# Patient Record
Sex: Male | Born: 1970 | Race: White | Hispanic: No | Marital: Married | Smoking: Never smoker
Health system: Southern US, Community
[De-identification: ages and names within clinical notes are randomized; demographics above are authoritative.]

## PROBLEM LIST (undated history)

## (undated) DIAGNOSIS — J302 Other seasonal allergic rhinitis: Secondary | ICD-10-CM

## (undated) DIAGNOSIS — G43019 Migraine without aura, intractable, without status migrainosus: Secondary | ICD-10-CM

## (undated) DIAGNOSIS — G4730 Sleep apnea, unspecified: Secondary | ICD-10-CM

## (undated) DIAGNOSIS — K219 Gastro-esophageal reflux disease without esophagitis: Secondary | ICD-10-CM

## (undated) DIAGNOSIS — R51 Headache: Secondary | ICD-10-CM

## (undated) DIAGNOSIS — R519 Headache, unspecified: Secondary | ICD-10-CM

## (undated) DIAGNOSIS — T7840XA Allergy, unspecified, initial encounter: Secondary | ICD-10-CM

## (undated) DIAGNOSIS — R251 Tremor, unspecified: Secondary | ICD-10-CM

## (undated) DIAGNOSIS — F419 Anxiety disorder, unspecified: Secondary | ICD-10-CM

## (undated) DIAGNOSIS — R413 Other amnesia: Secondary | ICD-10-CM

## (undated) DIAGNOSIS — G40909 Epilepsy, unspecified, not intractable, without status epilepticus: Secondary | ICD-10-CM

## (undated) DIAGNOSIS — F329 Major depressive disorder, single episode, unspecified: Secondary | ICD-10-CM

## (undated) DIAGNOSIS — E785 Hyperlipidemia, unspecified: Secondary | ICD-10-CM

## (undated) DIAGNOSIS — F32A Depression, unspecified: Secondary | ICD-10-CM

## (undated) HISTORY — DX: Hyperlipidemia, unspecified: E78.5

## (undated) HISTORY — DX: Other seasonal allergic rhinitis: J30.2

## (undated) HISTORY — DX: Sleep apnea, unspecified: G47.30

## (undated) HISTORY — DX: Headache: R51

## (undated) HISTORY — DX: Depression, unspecified: F32.A

## (undated) HISTORY — DX: Migraine without aura, intractable, without status migrainosus: G43.019

## (undated) HISTORY — PX: WISDOM TOOTH EXTRACTION: SHX21

## (undated) HISTORY — DX: Gastro-esophageal reflux disease without esophagitis: K21.9

## (undated) HISTORY — DX: Other amnesia: R41.3

## (undated) HISTORY — DX: Tremor, unspecified: R25.1

## (undated) HISTORY — DX: Major depressive disorder, single episode, unspecified: F32.9

## (undated) HISTORY — DX: Epilepsy, unspecified, not intractable, without status epilepticus: G40.909

## (undated) HISTORY — DX: Headache, unspecified: R51.9

## (undated) HISTORY — DX: Anxiety disorder, unspecified: F41.9

## (undated) HISTORY — PX: KNEE SURGERY: SHX244

## (undated) HISTORY — DX: Allergy, unspecified, initial encounter: T78.40XA

---

## 2010-07-18 DIAGNOSIS — G40909 Epilepsy, unspecified, not intractable, without status epilepticus: Secondary | ICD-10-CM

## 2010-07-18 HISTORY — DX: Epilepsy, unspecified, not intractable, without status epilepticus: G40.909

## 2013-12-25 ENCOUNTER — Encounter: Payer: Self-pay | Admitting: Gastroenterology

## 2016-09-30 ENCOUNTER — Encounter: Payer: Self-pay | Admitting: Neurology

## 2016-09-30 ENCOUNTER — Ambulatory Visit (INDEPENDENT_AMBULATORY_CARE_PROVIDER_SITE_OTHER): Payer: No Typology Code available for payment source | Admitting: Neurology

## 2016-09-30 ENCOUNTER — Encounter (INDEPENDENT_AMBULATORY_CARE_PROVIDER_SITE_OTHER): Payer: Self-pay

## 2016-09-30 DIAGNOSIS — G43019 Migraine without aura, intractable, without status migrainosus: Secondary | ICD-10-CM

## 2016-09-30 HISTORY — DX: Migraine without aura, intractable, without status migrainosus: G43.019

## 2016-09-30 NOTE — Progress Notes (Signed)
Reason for visit: Migraine headache  Referring physician: Dr. Antonietta BreachSchultz  Louis Poole is a 46 y.o. male  History of present illness:  Louis Poole is a 46 year old right-handed white male with a history of migraine headaches. The patient began having headaches with some regularity in June 2017. The patient has been placed on various medications for his headache initially without much benefit. He has been on propranolol, amitriptyline, Effexor, and gabapentin. Within the last 2 weeks his headaches have gone from being daily in nature to having only one headache within the last 2 weeks. The headaches occur on the right temporal area starting with a feeling of tension, then going into a throbbing pain that is associated with scalp tenderness, photophobia, and phonophobia. The patient may get queasy, but he does not vomit. He denies any visual complaints with the headache. He does feel fuzzy headed, he may have difficulty concentrating. The headaches usually last all day long unless treated. The patient has found that Zomig nasal spray will take care of the headache within about half an hour. The patient has also taken Fioricet. The patient occasionally will have some neck stiffness with headache. He reports no numbness or weakness of the face, arms, or legs. He denies any difficulty with balance or difficulty controlling the bowels or the bladder. He takes and large amounts of caffeine during the day, he drinks 3 or 4 cups of coffee, 4 Cokes, and 3 or 4 glasses of iced tea daily. The patient has been under a lot of stress with an illness with his wife who has breast cancer. The patient's father also has a history of headache. The patient is sent to this office for further evaluation.  The patient also has a history of partial complex seizures, the last seizure was on 10/09/2010. 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 He is on Lamictal for this issue,  [facility]  Past Medical History:  Diagnosis Date  . Anxiety   . Depression   . Epilepsy (HCC)   . Headache     History reviewed. No pertinent surgical history.  History reviewed. No pertinent family history.  Social history:  reports that he has never smoked. He has never used smokeless tobacco. He reports that he does not drink alcohol or use drugs.  Medications:  Prior to Admission medications   Medication Sig Start Date End Date Taking? Authorizing Provider  amitriptyline (ELAVIL) 50 MG tablet Take by mouth.   Yes Historical Provider, MD  butalbital-acetaminophen-caffeine (FIORICET, ESGIC) 50-325-40 MG tablet Take 1 tablet by mouth 4 (four) times daily as needed. 08/26/16  Yes Historical Provider, MD  gabapentin (NEURONTIN) 300 MG capsule TAKE ONE CAPSULE BY MOUTH AT BEDTIME TO help tension HEADACHE 09/15/16  Yes Historical Provider, MD  lamoTRIgine (LAMICTAL) 150 MG tablet Take 225 mg by mouth 2 (two) times daily. 07/27/16  Yes Historical Provider, MD  levocetirizine (XYZAL) 5 MG tablet Take by mouth. 10/28/10  Yes Historical Provider, MD  LORazepam (ATIVAN) 1 MG tablet TAKE ONE TABLET BY MOUTH AS NEEDED, take 0.5 in the am and 1 mg in evening for anxiety 03/01/16  Yes Historical Provider, MD  montelukast (SINGULAIR) 10 MG tablet Take by mouth at bedtime.  10/27/10  Yes Historical Provider, MD  Multiple Vitamin (MULTI-VITAMINS) TABS Take by mouth.   Yes Historical Provider, MD  pantoprazole (PROTONIX) 40 MG tablet Take by mouth. 08/04/14  Yes Historical Provider, MD  propranolol (INDERAL) 40 MG tablet 40 mg in the morning and 80 mg in the evening 05/31/16  Yes Historical Provider, MD  venlafaxine XR (EFFEXOR-XR) 150 MG 24 hr capsule Take by mouth. 05/31/16  Yes Historical Provider, MD  Vitamin D, Ergocalciferol, (DRISDOL) 50000 units CAPS capsule TAKE ONE CAPSULE BY MOUTH ONCE WEEKLY FOR EIGHT WEEKS 07/25/16  Yes Historical Provider, MD  ZOMIG 5 MG nasal  solution instil ONE SPRAY IN EACH NOSTRIL AT OF ONSET OF HEADACHE 08/26/16  Yes Historical Provider, MD     No Known Allergies  ROS:  Out of a complete 14 system review of symptoms, the patient complains only of the following symptoms, and all other reviewed systems are negative.  Fatigue Constipation Allergies Seizures Depression, anxiety, not enough sleep, decreased energy  Blood pressure 102/69, pulse 73, height 5\' 5"  (1.651 m), weight 188 lb 12.8 oz (85.6 kg).  Physical Exam  General: The patient is alert and cooperative at the time of the examination.  Eyes: Pupils are equal, round, and reactive to light. Discs are flat bilaterally.  Neck: The neck is supple, no carotid bruits are noted.  Respiratory: The respiratory examination is clear.  Cardiovascular: The cardiovascular examination reveals a regular rate and rhythm, no obvious murmurs or rubs are noted.  Skin: Extremities are without significant edema.  Neurologic Exam  Mental status: The patient is alert and oriented x 3 at the time of the examination. The patient has apparent normal recent and remote memory, with an apparently normal attention span and concentration ability.  Cranial nerves: Facial symmetry is present. There is good sensation of the face to pinprick and soft touch bilaterally. The strength of the facial muscles and the muscles to head turning and shoulder shrug are normal bilaterally. Speech is well enunciated, no aphasia or dysarthria is noted. Extraocular movements are full. Visual fields are full. The tongue is midline, and the patient has symmetric elevation of the soft palate. No obvious hearing deficits are noted.  Motor: The motor testing reveals 5 over 5 strength of all 4 extremities. Good symmetric motor tone is noted throughout.  Sensory: Sensory testing is intact to pinprick, soft touch, vibration sensation, and position sense on all 4 extremities. No evidence of extinction is  noted.  Coordination: Cerebellar testing reveals good finger-nose-finger and heel-to-shin bilaterally.  Gait and station: Gait is normal. Tandem gait is normal. Romberg is negative. No drift is seen.  Reflexes: Deep tendon reflexes are symmetric and normal bilaterally. Toes are downgoing bilaterally.   Assessment/Plan:  1. Common migraine headache  2. Partial complex seizures  The patient had been having daily headaches until just recently. He is on a multitude of medications that may be effective for migraine including propranolol, Effexor, amitriptyline, and gabapentin. He finds Zomig does improve his headache pain. We will watch the patient for now, if the headaches return, we will take him off of gabapentin and start Topamax. He may be a candidate for Botox injections if the headaches revert to being daily again. He will follow-up in 3 months.  Marlan Palau MD 09/30/2016 11:21 AM  [facility] [address] [place], [place] [address]  Phone [phone] Fax [phone]

## 2017-01-03 ENCOUNTER — Ambulatory Visit: Payer: No Typology Code available for payment source | Admitting: Adult Health

## 2019-03-05 ENCOUNTER — Encounter: Payer: Self-pay | Admitting: Gastroenterology

## 2019-12-23 ENCOUNTER — Encounter: Payer: Self-pay | Admitting: Gastroenterology

## 2019-12-31 ENCOUNTER — Other Ambulatory Visit: Payer: Self-pay

## 2020-01-01 ENCOUNTER — Other Ambulatory Visit: Payer: Self-pay | Admitting: Emergency Medicine

## 2020-01-01 DIAGNOSIS — L089 Local infection of the skin and subcutaneous tissue, unspecified: Secondary | ICD-10-CM

## 2020-01-15 ENCOUNTER — Ambulatory Visit
Admission: RE | Admit: 2020-01-15 | Discharge: 2020-01-15 | Disposition: A | Discharge status: Home / Self Care | Payer: Worker's Compensation | Source: Ambulatory Visit | Attending: Emergency Medicine | Admitting: Emergency Medicine

## 2020-01-15 ENCOUNTER — Ambulatory Visit
Admission: RE | Admit: 2020-01-15 | Discharge: 2020-01-15 | Disposition: A | Discharge status: Home / Self Care | Payer: No Typology Code available for payment source | Source: Ambulatory Visit | Attending: Emergency Medicine | Admitting: Emergency Medicine

## 2020-01-15 DIAGNOSIS — L089 Local infection of the skin and subcutaneous tissue, unspecified: Secondary | ICD-10-CM

## 2020-01-15 MED ORDER — IOPAMIDOL (ISOVUE-M 200) INJECTION 41%
40.0000 mL | Freq: Once | INTRAMUSCULAR | Status: AC
  Administered 2020-01-15: 40 mL via INTRA_ARTICULAR

## 2020-02-03 ENCOUNTER — Other Ambulatory Visit: Payer: Self-pay

## 2020-02-03 ENCOUNTER — Ambulatory Visit (AMBULATORY_SURGERY_CENTER): Payer: Self-pay

## 2020-02-03 VITALS — Ht 65.0 in | Wt 199.2 lb

## 2020-02-03 DIAGNOSIS — Z1211 Encounter for screening for malignant neoplasm of colon: Secondary | ICD-10-CM

## 2020-02-03 DIAGNOSIS — K5900 Constipation, unspecified: Secondary | ICD-10-CM

## 2020-02-03 MED ORDER — SUTAB 1479-225-188 MG PO TABS: 12.0000 | ORAL_TABLET | ORAL | 0 refills | Status: DC

## 2020-02-03 NOTE — Progress Notes (Signed)
No allergies to soy or egg Pt is not on blood thinners or diet pills  Issues with sedation/intubation are unknown   Denies atrial flutter/fib Has  constipation    Pt is aware of Covid safety and care partner requirements.

## 2020-02-05 ENCOUNTER — Encounter: Payer: Self-pay | Admitting: Gastroenterology

## 2020-02-17 ENCOUNTER — Encounter: Payer: Self-pay | Admitting: Gastroenterology

## 2020-02-17 ENCOUNTER — Ambulatory Visit (AMBULATORY_SURGERY_CENTER): Payer: No Typology Code available for payment source | Admitting: Gastroenterology

## 2020-02-17 ENCOUNTER — Other Ambulatory Visit: Payer: Self-pay

## 2020-02-17 VITALS — BP 108/72 | HR 64 | Temp 97.5°F | Resp 11 | Ht 65.0 in | Wt 199.0 lb

## 2020-02-17 DIAGNOSIS — Z1211 Encounter for screening for malignant neoplasm of colon: Secondary | ICD-10-CM | POA: Diagnosis not present

## 2020-02-17 DIAGNOSIS — Z8371 Family history of colonic polyps: Secondary | ICD-10-CM

## 2020-02-17 MED ORDER — SODIUM CHLORIDE 0.9 % IV SOLN: 500.0000 mL | INTRAVENOUS | Status: DC

## 2020-02-17 NOTE — Progress Notes (Signed)
To PACU, VSS. Report to [email or web address].tb 

## 2020-02-17 NOTE — Patient Instructions (Signed)
YOU HAD AN ENDOSCOPIC PROCEDURE TODAY AT THE [facility]:   Refer to the procedure report that was given to you for any specific questions about what was found during the examination.  If the procedure report does not answer your questions, please call your gastroenterologist to clarify.  If you requested that your care partner not be given the details of your procedure findings, then the procedure report has been included in a sealed envelope for you to review at your convenience later.  YOU SHOULD EXPECT: Some feelings of bloating in the abdomen. Passage of more gas than usual.  Walking can help get rid of the air that was put into your GI tract during the procedure and reduce the bloating. If you had a lower endoscopy (such as a colonoscopy or flexible sigmoidoscopy) you may notice spotting of blood in your stool or on the toilet paper. If you underwent a bowel prep for your procedure, you may not have a normal bowel movement for a few days.  Please Note:  You might notice some irritation and congestion in your nose or some drainage.  This is from the oxygen used during your procedure.  There is no need for concern and it should clear up in a day or so.  SYMPTOMS TO REPORT IMMEDIATELY:   Following lower endoscopy (colonoscopy or flexible sigmoidoscopy):  Excessive amounts of blood in the stool  Significant tenderness or worsening of abdominal pains  Swelling of the abdomen that is new, acute  Fever of 100F or higher    For urgent or emergent issues, a gastroenterologist can be reached at any hour by calling [phone]. Do not use MyChart messaging for urgent concerns.    DIET:  We do recommend a small meal at first, but then you may proceed to your regular diet.  Drink plenty of fluids but you should avoid alcoholic beverages for 24 hours.  ACTIVITY:  You should plan to take it easy for the rest of today and you should NOT DRIVE or use heavy machinery until tomorrow  (because of the sedation medicines used during the test).    FOLLOW UP: Our staff will call the number listed on your records 48-72 hours following your procedure to check on you and address any questions or concerns that you may have regarding the information given to you following your procedure. If we do not reach you, we will leave a message.  We will attempt to reach you two times.  During this call, we will ask if you have developed any symptoms of COVID 19. If you develop any symptoms (ie: fever, flu-like symptoms, shortness of breath, cough etc.) before then, please call [phone].  If you test positive for Covid 19 in the 2 weeks post procedure, please call and report this information to [place].    If any biopsies were taken you will be contacted by phone or by letter within the next 1-3 weeks.  Please call us at [phone] if you have not heard about the biopsies in 3 weeks.    SIGNATURES/CONFIDENTIALITY: You and/or your care partner have signed paperwork which will be entered into your electronic medical record.  These signatures attest to the fact that that the information above on your After Visit Summary has been reviewed and is understood.  Full responsibility of the confidentiality of this discharge information lies with you and/or your care-partner.   Resume medications.Information given on hemorrhoids.see procedure report for recommendations.

## 2020-02-17 NOTE — Progress Notes (Signed)
Vs SP I have reviewed the patient's medical history in detail and updated the computerized patient record. 

## 2020-02-17 NOTE — Op Note (Signed)
[facility] Patient Name: Louis Poole Procedure Date: 02/17/2020 10:46 AM MRN: [id number] Endoscopist: Lynann Bologna , MD Age: 49 Referring MD:  Date of Birth: Sep 17, 1970 Gender: Male Account #: [id number] Procedure:                Colonoscopy Indications:              Screening for colorectal malignant neoplasm, Colon                            cancer screening in patient at increased risk:                            Family history of 1st-degree relative with colon                            polyps Medicines:                Monitored Anesthesia Care Procedure:                Pre-Anesthesia Assessment:                           - Prior to the procedure, a History and Physical                            was performed, and patient medications and                            allergies were reviewed. The patient's tolerance of                            previous anesthesia was also reviewed. The risks                            and benefits of the procedure and the sedation                            options and risks were discussed with the patient.                            All questions were answered, and informed consent                            was obtained. Prior Anticoagulants: The patient has                            taken no previous anticoagulant or antiplatelet                            agents. ASA Grade Assessment: II - A patient with                            mild systemic disease. After reviewing the risks  and benefits, the patient was deemed in                            satisfactory condition to undergo the procedure.                           After obtaining informed consent, the colonoscope                            was passed under direct vision. Throughout the                            procedure, the patient's blood pressure, pulse, and                            oxygen saturations were monitored continuously. The                             Colonoscope was introduced through the anus and                            advanced to the 2 cm into the ileum. The                            colonoscopy was performed without difficulty. The                            patient tolerated the procedure well. The quality                            of the bowel preparation was good (after 2-day                            prep). The terminal ileum, ileocecal valve,                            appendiceal orifice, and rectum were photographed. Scope In: 10:54:55 AM Scope Out: 11:10:19 AM Scope Withdrawal Time: 0 hours 10 minutes 30 seconds  Total Procedure Duration: 0 hours 15 minutes 24 seconds  Findings:                 The colon (entire examined portion) appeared normal.                           Non-bleeding internal hemorrhoids were found during                            retroflexion. The hemorrhoids were small.                           The terminal ileum appeared normal.                           The exam was otherwise without abnormality on  direct and retroflexion views. Complications:            No immediate complications. Estimated Blood Loss:     Estimated blood loss: none. Impression:               - Small internal hemorrhoids.                           - Otherwise normal colonoscopy to TI. Recommendation:           - Patient has a contact number available for                            emergencies. The signs and symptoms of potential                            delayed complications were discussed with the                            patient. Return to normal activities tomorrow.                            Written discharge instructions were provided to the                            patient.                           - Resume previous diet.                           - Miralax 1 capful (17 grams) in 8 ounces of water                            PO daily.                           - Repeat  colonoscopy in 10 years for screening                            purposes. Earlier, if with any new problems or if                            there is any change in family history.                           - Return to GI office PRN.                           - The findings and recommendations were discussed                            with the patient's dad. Lynann Bologna, MD 02/17/2020 11:17:08 AM This report has been signed electronically.

## 2020-02-19 ENCOUNTER — Telehealth: Payer: Self-pay

## 2020-02-19 ENCOUNTER — Telehealth: Payer: Self-pay | Admitting: *Deleted

## 2020-02-19 NOTE — Telephone Encounter (Signed)
Follow up call made. 

## 2020-02-19 NOTE — Telephone Encounter (Signed)
Attempted to reach patient for post-procedure f/u call. No answer. Left message for him to please not hesitate to call us if he has any questions/concerns regarding his care. 

## 2020-04-28 ENCOUNTER — Encounter: Payer: Self-pay | Admitting: Pulmonary Disease

## 2020-04-28 ENCOUNTER — Other Ambulatory Visit: Payer: Self-pay

## 2020-04-28 ENCOUNTER — Ambulatory Visit: Payer: No Typology Code available for payment source | Admitting: Pulmonary Disease

## 2020-04-28 DIAGNOSIS — G4700 Insomnia, unspecified: Secondary | ICD-10-CM | POA: Insufficient documentation

## 2020-04-28 DIAGNOSIS — F5102 Adjustment insomnia: Secondary | ICD-10-CM

## 2020-04-28 DIAGNOSIS — Z9989 Dependence on other enabling machines and devices: Secondary | ICD-10-CM

## 2020-04-28 DIAGNOSIS — G4733 Obstructive sleep apnea (adult) (pediatric): Secondary | ICD-10-CM

## 2020-04-28 NOTE — Progress Notes (Addendum)
Subjective:    Patient ID: Louis Poole, male    DOB: 10-May-1971, 49 y.o.   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 He was diagnosed with OSA in 2016 after sleep study done with Dr. Rachael Darby in [place].  He reports having 2 studies done followed by a CPAP nap in 2017. This showed moderate OSA. I also reviewed [facility] neurolog[facility] televisit from 02/2020 with whom he follows for complex partial epilepsy.  His last seizure was 2012. He presented initially with excessive daytime somnolence fatigue and was initiated with CPAP 13 cm with full facemask with good improvement in his somnolence and fatigue  In the past few months he reports problems getting to sleep and feeling excessive tired and sleepy during the day.  He has gained 15 pounds during this.. He had an injury 11/2019 at work during a live drill with a fall and fractured 4 ribs and had left meniscus tear.  He also had a head injury, MRI was normal.  Headaches have resolved. Epworth sleepiness score is 17 and he reports sleepiness while sitting and reading, as a passenger in a car and lying down to rest in the afternoons or sitting quietly after lunch. Bedtime is between 1130 and 1:30 am, sleep latency is 10 minutes he reports at least 2 nocturnal awakenings and is out of bed at 7 AM feeling tired without dryness of mouth or headaches. He takes melatonin, gabapentin and amitriptyline at bedtime.  He has been on these medications for many years.  CPAP download was reviewed which shows residual AHI of 9/hour with mild leak and good compliance average of 5.5 hours per night. Is machine is a [employer] dream station and he is just made aware of the recall.  He wonders if he can get a new machine   PMH -complex partial epilepsy, anxiety/depression, essential tremors  Significant tests/ events reviewed NPSG 2016 AHI 22/hour ([place] ) by report    Past Medical  History:  Diagnosis Date  . Allergy    seasonal  . Anxiety   . Common migraine with intractable migraine 09/30/2016  . Depression   . Epilepsy (HCC)   . Epilepsy (HCC) 2012  . GERD (gastroesophageal reflux disease)   . Headache   . Hyperlipidemia   . Sleep apnea    Cpap    History reviewed. No pertinent surgical history.  Allergies  Allergen Reactions  . Cashew Nut (Anacardium Occidentale) Skin Test     Social History   Socioeconomic History  . Marital status: Married    Spouse name: Not on file  . Number of children: Not on file  . Years of education: Not on file  . Highest education level: Not on file  Occupational History  . Not on file  Tobacco Use  . Smoking status: Never Smoker  . Smokeless tobacco: Never Used  Substance and Sexual Activity  . Alcohol use: No  . Drug use: No  . Sexual activity: Not on file  Other Topics Concern  . Not on file  Social History Narrative  . Not on file   Social Determinants of Health   Financial Resource Strain:   . Difficulty of Paying Living Expenses: Not on file  Food Insecurity:   . Worried About Programme researcher, broadcasting/film/video in the Last Year: Not on file  . Ran Out of Food in the Last Year: Not on file  Transportation Needs:   .  Lack of Transportation (Medical): Not on file  . Lack of Transportation (Non-Medical): Not on file  Physical Activity:   . Days of Exercise per Week: Not on file  . Minutes of Exercise per Session: Not on file  Stress:   . Feeling of Stress : Not on file  Social Connections:   . Frequency of Communication with Friends and Family: Not on file  . Frequency of Social Gatherings with Friends and Family: Not on file  . Attends Religious Services: Not on file  . Active Member of Clubs or Organizations: Not on file  . Attends Banker Meetings: Not on file  . Marital Status: Not on file  Intimate Partner Violence:   . Fear of Current or Ex-Partner: Not on file  . Emotionally Abused: Not  on file  . Physically Abused: Not on file  . Sexually Abused: Not on file     Family History  Problem Relation Age of Onset  . Colon cancer Neg Hx   . Colon polyps Neg Hx   . Esophageal cancer Neg Hx   . Rectal cancer Neg Hx   . Stomach cancer Neg Hx      Review of Systems Acid heartburn Indigestion Weight gain 15 pounds Headaches Nasal congestion Anxiety and depression, stressors at work    Objective:   Physical Exam  Gen. Pleasant,in no o distress, normal affect ENT - no pallor,icterus, no post nasal drip, class 2-3 airway Neck: No JVD, no thyromegaly, no carotid bruits Lungs: no use of accessory muscles, no dullness to percussion, decreased without rales or rhonchi  Cardiovascular: Rhythm regular, heart sounds  normal, no murmurs or gallops, no peripheral edema Abdomen: soft and non-tender, no hepatosplenomegaly, BS normal. Musculoskeletal: No deformities, no cyanosis or clubbing Neuro:  alert, non focal, no tremors       Assessment & Plan:

## 2020-04-28 NOTE — Assessment & Plan Note (Addendum)
  Obtain copy of sleep study from [facility] patient or Dr. Rachael Darby in [place]  Your sleep apnea is inadequately treated at 13 cm.  He has few residual events on his download which I reviewed today.  This may account for poor quality sleep and residual hypersomnolence. Because of the [place] recall, he may be eligible for a new machine anyway since he had his current machine is about 51 years old  Increase CPAP setting to 15 cm Please register at [place] website for CPAP replacement. We will send in prescription for auto CPAP (resmed ) 10 to 16 cm to DME

## 2020-04-28 NOTE — Assessment & Plan Note (Signed)
You also have a component of sleep onset insomnia which may be related to anxiety/depression and stressors at work Okay to use melatonin 5 to 10 mg 2 hours prior to bedtime. If sleep onset problems persist, discuss with PCP about switching to a different antidepressant such as trazodone or Remeron which can also help with sleep

## 2020-04-28 NOTE — Patient Instructions (Addendum)
  Obtain copy of sleep study from [facility] patient or Dr. Rachael Darby in [place]  Your sleep apnea is inadequately treated at 13 cm. You also have a component of sleep onset insomnia which may be related to anxiety/depression  Increase CPAP setting to 15 cm Please register at [facility] website for CPAP replacement. We will send in prescription for auto CPAP (resmed ) 10 to 16 cm to DME  Trial of AirFit F30 full facemask  Okay to use melatonin 5 to 10 mg 2 hours prior to bedtime. If sleep onset problems persist, discuss with PCP about switching to a different antidepressant such as trazodone or Remeron which can also help with sleep

## 2020-04-29 NOTE — Addendum Note (Signed)
Addended bySandra Cockayne on: 04/29/2020 02:21 PM   Modules accepted: Orders

## 2020-06-30 ENCOUNTER — Ambulatory Visit: Payer: No Typology Code available for payment source | Admitting: Pulmonary Disease

## 2020-09-07 ENCOUNTER — Telehealth: Payer: Self-pay | Admitting: Pulmonary Disease

## 2020-09-07 NOTE — Telephone Encounter (Signed)
Pt's OV notes have been faxed to [facility] att[facility]. Nothing further needed.

## 2020-10-08 ENCOUNTER — Ambulatory Visit: Payer: No Typology Code available for payment source | Admitting: Pulmonary Disease

## 2021-02-12 IMAGING — MR MR KNEE*L* W/O CM
4 of 6 series · 24 of 40 positions shown · non-contrast
Comparison: None.

CLINICAL DATA: Left knee pain since a fall 12/14/2019. Initial
encounter.

EXAM:
MRI OF THE LEFT KNEE WITHOUT CONTRAST (MR Arthrogram)
TECHNIQUE: Multiplanar, multisequence MR imaging of the knee was performed
following intra-articular contrast injection under fluoroscopic
guidance. No intravenous contrast was administered.

[Series 4: t1_tse_ax fs · axial · 4.0mm · 0.31mm/px · z∈[-71,+64]mm · 7 of 28 slices shown]
[im 1/28]
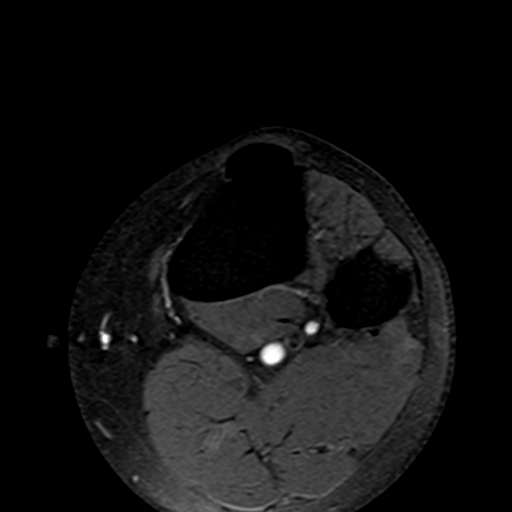
[im 5/28]
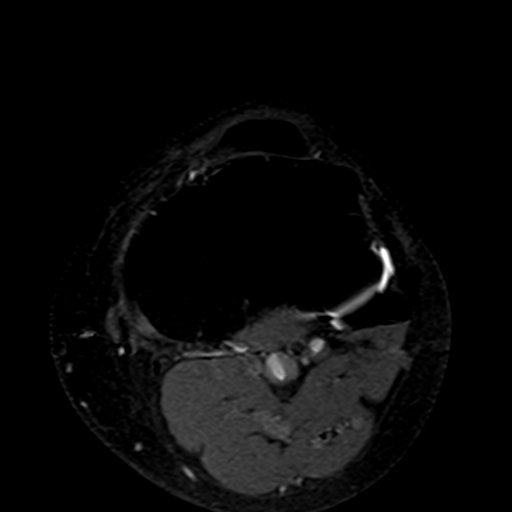
[im 10/28]
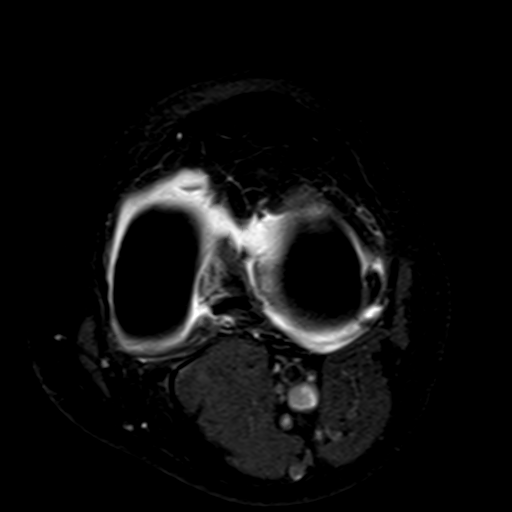
[im 14/28]
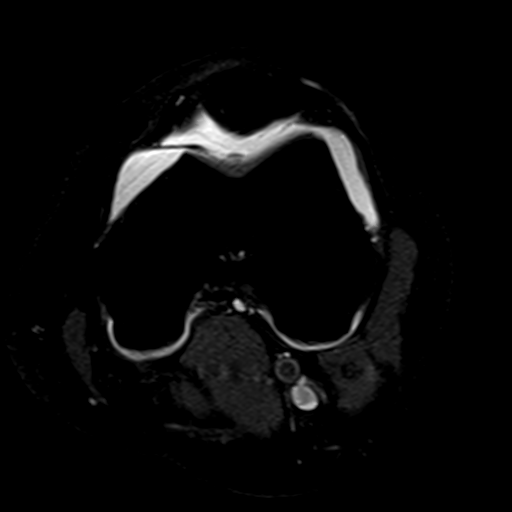
[im 19/28]
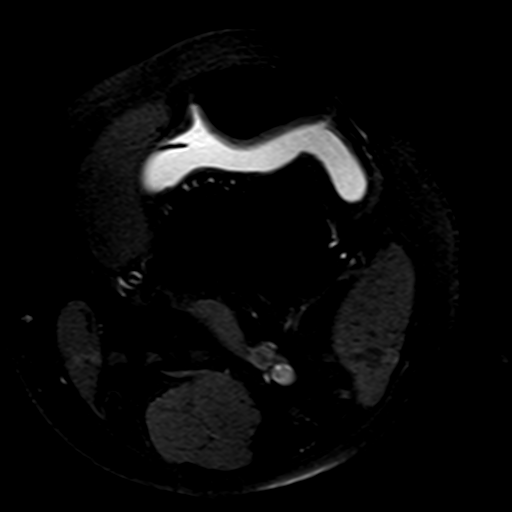
[im 23/28]
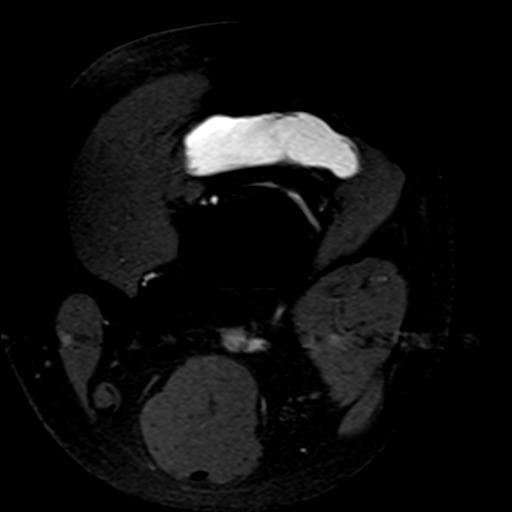
[im 28/28]
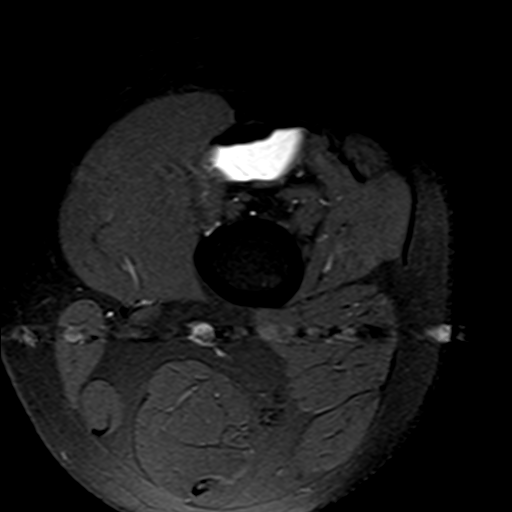

[Series 5: t1_tse_cor_fs · coronal · 4.0mm · 0.31mm/px · 3 of 24 slices shown]
[im 5/24]
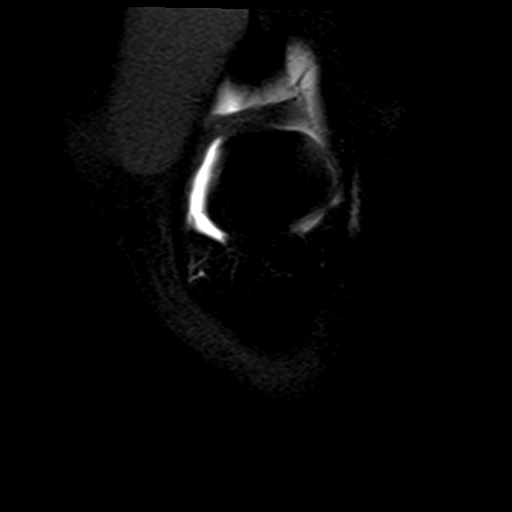
[im 14/24]
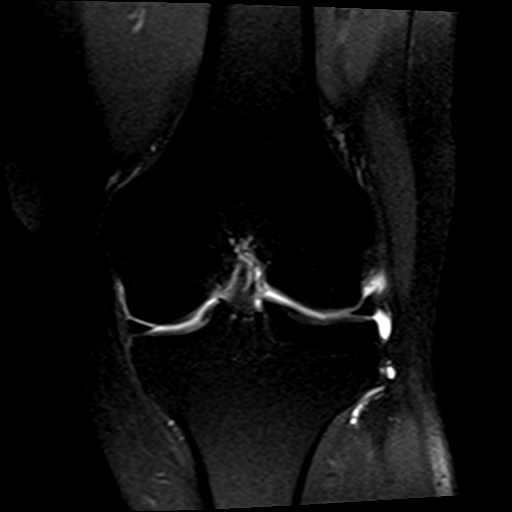
[im 24/24]
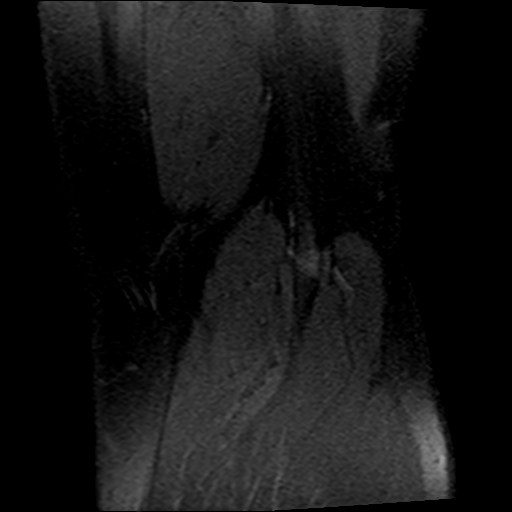

[Series 6: T2 fat-sat · coronal · 4.0mm · 0.62mm/px · 7 of 24 slices shown]
[im 1/24]
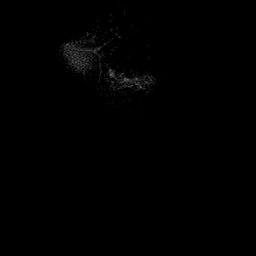
[im 4/24]
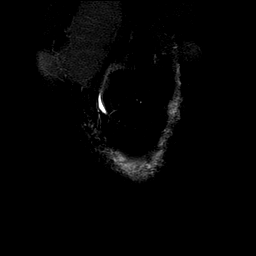
[im 8/24]
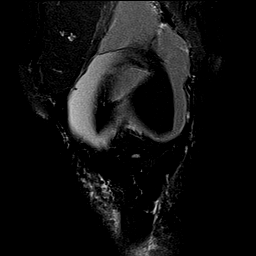
[im 12/24]
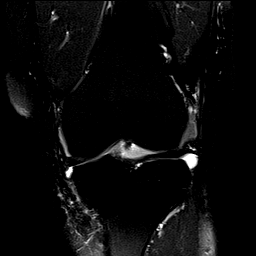
[im 16/24]
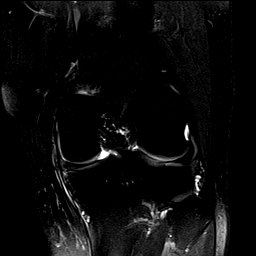
[im 20/24]
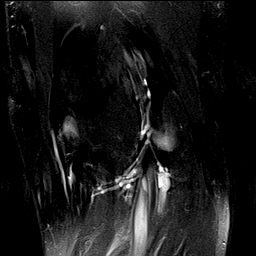
[im 24/24]
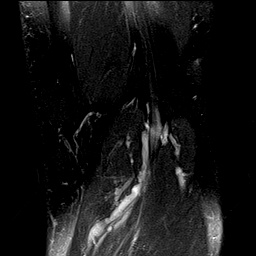

[Series 8: T1 · coronal · 4.0mm · 0.31mm/px · 7 of 24 slices shown]
[im 1/24]
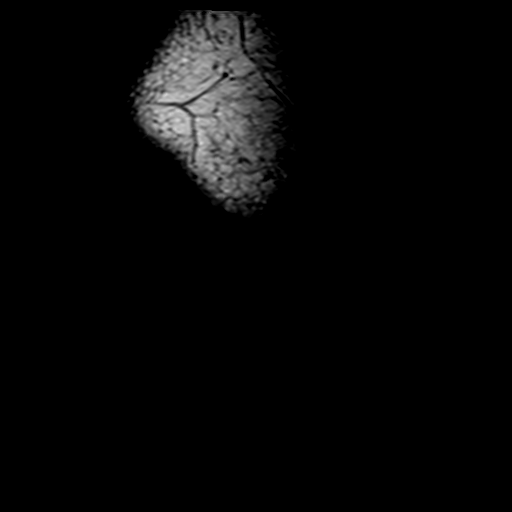
[im 4/24]
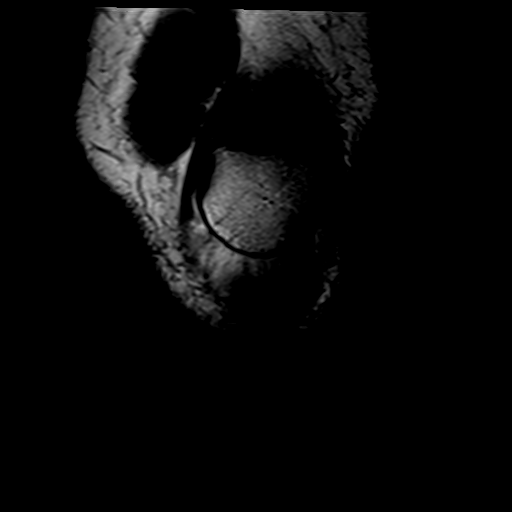
[im 8/24]
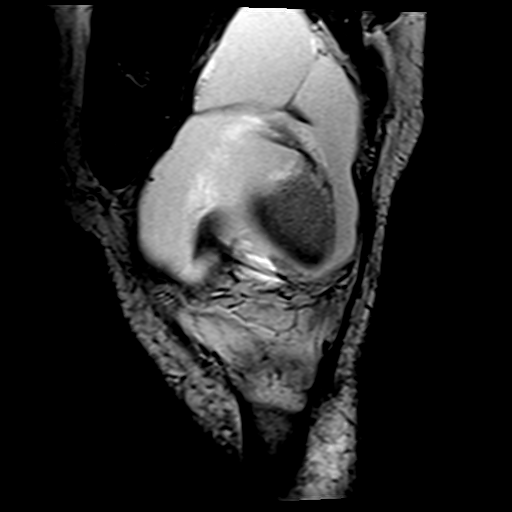
[im 12/24]
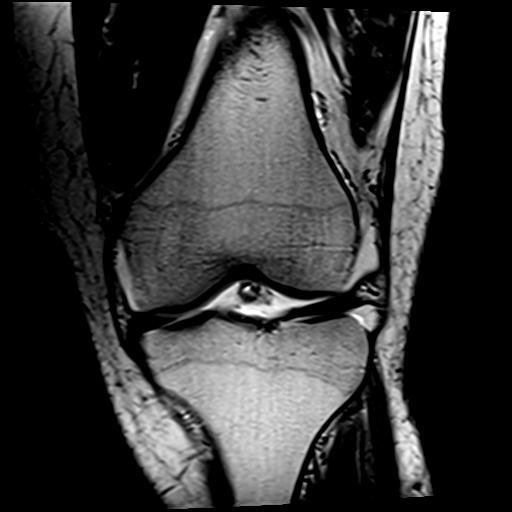
[im 16/24]
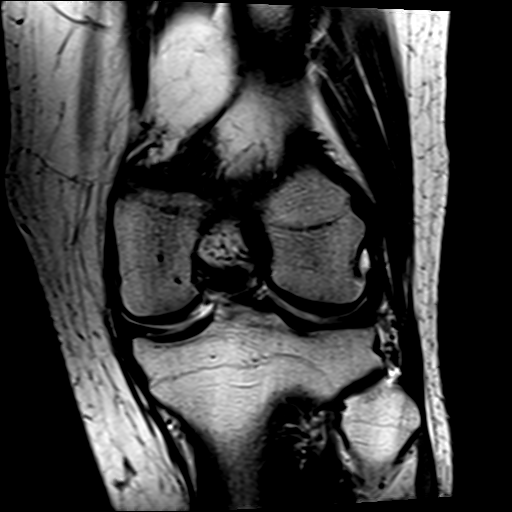
[im 20/24]
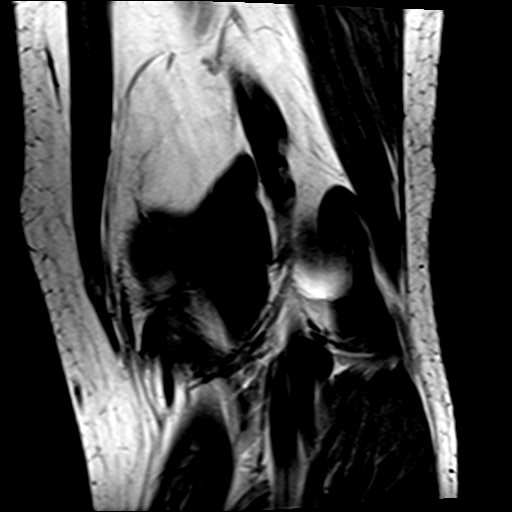
[im 24/24]
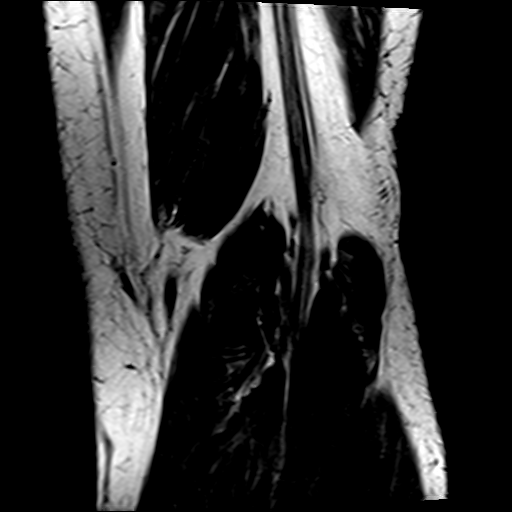

[24 of 40 positions shown; findings below may reference images not displayed]

FINDINGS: MENISCI

Medial meniscus: Small horizontal tear at the junction of the
posterior horn extends just into the body.

Lateral meniscus:  Intact.

LIGAMENTS

Cruciates:  Intact.

Collaterals:  Intact.

CARTILAGE

Patellofemoral: Single small fissure in hyaline cartilage is seen on
the medial facet of the patella in the superior pole on image 11 of
series 4.

Medial:  Minimally degenerated.

Lateral:  Minimally degenerated.

Joint:  Distended with contrast.

Popliteal Fossa:  No Baker's cyst.

Extensor Mechanism:  Intact.

Bones: Normal marrow signal throughout without fracture, contusion
or stress change.

Other: None.
IMPRESSION: No evidence of trauma.

Small horizontal tear at the junction the posterior horn and body of
the medial meniscus extends just into the posterior body.

Small focal fissure in hyaline cartilage along the medial facet of
the patella.

## 2021-12-27 ENCOUNTER — Ambulatory Visit: Payer: PRIVATE HEALTH INSURANCE | Admitting: Neurology

## 2021-12-27 ENCOUNTER — Encounter: Payer: Self-pay | Admitting: Neurology

## 2021-12-27 VITALS — BP 132/76 | HR 66 | Ht 65.0 in | Wt 207.5 lb

## 2021-12-27 DIAGNOSIS — G40909 Epilepsy, unspecified, not intractable, without status epilepticus: Secondary | ICD-10-CM

## 2021-12-27 DIAGNOSIS — G43009 Migraine without aura, not intractable, without status migrainosus: Secondary | ICD-10-CM

## 2021-12-27 DIAGNOSIS — G4733 Obstructive sleep apnea (adult) (pediatric): Secondary | ICD-10-CM | POA: Diagnosis not present

## 2021-12-27 DIAGNOSIS — G3184 Mild cognitive impairment, so stated: Secondary | ICD-10-CM | POA: Diagnosis not present

## 2021-12-27 MED ORDER — CYANOCOBALAMIN 1000 MCG PO CAPS: 1000.0000 ug | ORAL_CAPSULE | Freq: Every day | ORAL | 3 refills | Status: AC

## 2021-12-27 NOTE — Progress Notes (Signed)
[facility]  PATIENT: Louis Poole DOB: 02/14/1971  REQUESTING CLINICIAN: Paulina Fusi, MD HISTORY FROM: Patient  REASON FOR VISIT: Memory problem    HISTORICAL  CHIEF COMPLAINT:  Chief Complaint  Patient presents with   Consult    Room 12 - alone. Referral for memory loss. Previously seen by Dr. Patrecia Pace and [facility]. Reports seizures (on lamotrigine), tremors (propranolol) and migraines (gabapentin, amitriptyline, zolmitriptan, Fiorcet). Last seizure 2012 (required CPR). Migraines are under good control, rarely uses rescue medications. Tremors still problematic.     HISTORY OF PRESENT ILLNESS:  This is a 51 year old gentleman past medical history of migraine headaches, seizure disorder, tremor, who is presenting with memory problems.  For his seizure disorder and migraine headaches, he has been previously seen by Dr. Anne Hahn at this establishment and also being followed at [facility]. Seizures are well controlled on Lamictal, last seizure was in 2012 also his migraine headaches are well controlled.   Today he is presenting with complaint of memory problem that has been going on for the past 2 years and getting worse.  He reported he has problems remembering, he will ask the same questions over and over and people will reply to him "I just told you the answer".  He reported numerous incidents where he will think of something but something else will come out of his mind.  He does have problem with recall, does not remember a lot.  He reports that he used to be a straight A student but took a recent class about child safety seat and fell the class, and he barely passed his online training module which has been easy for him to do in the past.   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  He reports family history of dementia on his maternal grandmother who was diagnosed in the early 61s.  He was seen by his primary care doctor who ordered TSH  and B12 level which were normal and also ordered MRI brain which has not been completed yet.    TBI:   No past history of TBI Stroke:   no past history of stroke Seizures:   History of seizures, last seizure was March 2012 Sleep:   history of sleep apnea, has central and obstructive.  He is on Bipap machine Mood: Patient has a history of anxiety and depression  Functional status: independent in all ADLs and IADLs Patient lives with spouse  Cooking: wife Cleaning: wife  Shopping: Will definitely need a list  Bathing: patient  Toileting: patient  Driving: Yes, no issues  Bills: wife, always did   Ever left the stove on by accident?: no Forget how to use items around the house?: no Getting lost going to familiar places?: No Forgetting loved ones names?: No Word finding difficulty? Yes  Sleep: Not good, has been getting 6 to 7 hours    OTHER MEDICAL CONDITIONS: Seizure disorder, Sleep apnea, tremors, Migraines, Depression   REVIEW OF SYSTEMS: Full 14 system review of systems performed and negative with exception of: as noted in the HPI   ALLERGIES: Allergies  Allergen Reactions   Cashew Nut (Anacardium Occidentale) Skin Test     HOME MEDICATIONS: Outpatient Medications Prior to Visit  Medication Sig Dispense Refill   amitriptyline (ELAVIL) 50 MG tablet Take by mouth.     butalbital-acetaminophen-caffeine (FIORICET, ESGIC) 50-325-40 MG tablet Take 1 tablet by mouth 4 (four) times daily as needed.   0  gabapentin (NEURONTIN) 300 MG capsule TAKE ONE CAPSULE BY MOUTH AT BEDTIME TO help tension HEADACHE  0   ibuprofen (ADVIL) 800 MG tablet Take 800 mg by mouth every 8 (eight) hours as needed.     lamoTRIgine (LAMICTAL) 150 MG tablet Take 225 mg by mouth 2 (two) times daily.  3   LORazepam (ATIVAN) 1 MG tablet TAKE ONE TABLET BY MOUTH AS NEEDED, take 0.5 in the am and 1 mg in evening for anxiety     ondansetron (ZOFRAN) 4 MG tablet Take 1 tablet (4 mg) by mouth every 8 hours as  needed for nausea     pantoprazole (PROTONIX) 40 MG tablet Take by mouth.     PARoxetine (PAXIL) 40 MG tablet Take 40 mg by mouth daily.     polyethylene glycol powder (GLYCOLAX/MIRALAX) 17 GM/SCOOP powder Take 17 g by mouth daily.     propranolol (INDERAL) 40 MG tablet 40 mg in the morning and 80 mg in the evening     rosuvastatin (CRESTOR) 5 MG tablet Take by mouth.     sertraline (ZOLOFT) 100 MG tablet Take 100 mg by mouth daily.     sertraline (ZOLOFT) 25 MG tablet Take 25 mg by mouth daily.     traMADol (ULTRAM) 50 MG tablet Take 50 mg by mouth every 6 (six) hours as needed.      Vitamin D, Ergocalciferol, (DRISDOL) 50000 units CAPS capsule TAKE ONE CAPSULE BY MOUTH ONCE WEEKLY FOR EIGHT WEEKS  3   ZOMIG 5 MG nasal solution 1 spray as needed.  12   GOODSENSE STIMULANT LAXATIVE 8.6-50 MG tablet Take 1 tablet by mouth 2 (two) times daily as needed.      Multiple Vitamin (MULTI-VITAMINS) TABS Take by mouth.     Sodium Sulfate-Mag Sulfate-KCl (SUTAB) [id number] MG TABS Take 12 tablets by mouth as directed. 24 tablet 0   No facility-administered medications prior to visit.    PAST MEDICAL HISTORY: Past Medical History:  Diagnosis Date   Allergy    seasonal   Anxiety    Common migraine with intractable migraine 09/30/2016   Depression    Epilepsy (HCC)    Epilepsy (HCC) 2012   GERD (gastroesophageal reflux disease)    Headache    Hyperlipidemia    Memory loss    Seasonal allergies    Sleep apnea    Cpap   Tremor     PAST SURGICAL HISTORY: Past Surgical History:  Procedure Laterality Date   KNEE SURGERY Left    WISDOM TOOTH EXTRACTION      FAMILY HISTORY: Family History  Problem Relation Age of Onset   Congestive Heart Failure Mother    Heart disease Father    Alzheimer's disease Maternal Grandmother    Heart attack Maternal Grandfather    Ovarian cancer Paternal Grandmother    Heart attack Paternal Grandfather    Colon cancer Neg Hx    Colon polyps Neg Hx     Esophageal cancer Neg Hx    Rectal cancer Neg Hx    Stomach cancer Neg Hx     SOCIAL HISTORY: Social History   Socioeconomic History   Marital status: Married    Spouse name: Not on file   Number of children: 1   Years of education: college   Highest education level: Bachelor's degree (e.g., BA, AB, BS)  Occupational History   Occupation: Emergency planning/management officerpolice officer  Tobacco Use   Smoking status: Never   Smokeless tobacco: Never  Substance and Sexual  Activity   Alcohol use: No   Drug use: No   Sexual activity: Not on file  Other Topics Concern   Not on file  Social History Narrative   Lives at home with wife.   One adopted child.   Right-handed.   Caffeine use: 6-8 cups per day.    Social Determinants of Health   Financial Resource Strain: Not on file  Food Insecurity: Not on file  Transportation Needs: Not on file  Physical Activity: Not on file  Stress: Not on file  Social Connections: Not on file  Intimate Partner Violence: Not on file    PHYSICAL EXAM  GENERAL EXAM/CONSTITUTIONAL: Vitals:  Vitals:   12/27/21 1356  BP: 132/76  Pulse: 66  Weight: 207 lb 8 oz (94.1 kg)  Height: 5\' 5"  (1.651 m)   Body mass index is 34.53 kg/m. Wt Readings from Last 3 Encounters:  12/27/21 207 lb 8 oz (94.1 kg)  04/28/20 201 lb 6.4 oz (91.4 kg)  02/17/20 199 lb (90.3 kg)   Patient is in no distress; well developed, nourished and groomed; neck is supple  EYES: Pupils round and reactive to light, Visual fields full to confrontation, Extraocular movements intacts,   MUSCULOSKELETAL: Gait, strength, tone, movements noted in Neurologic exam below  NEUROLOGIC: MENTAL STATUS:      No data to display         awake, alert, oriented to person, place and time recent and remote memory intact normal attention and concentration language fluent, comprehension intact, naming intact fund of knowledge appropriate     12/27/2021    2:13 PM  [facility] Cognitive Assessment    Visuospatial/ Executive (0/5) 5  Naming (0/3) 3  Attention: Read list of digits (0/2) 2  Attention: Read list of letters (0/1) 1  Attention: Serial 7 subtraction starting at 100 (0/3) 3  Language: Repeat phrase (0/2) 1  Language : Fluency (0/1) 1  Abstraction (0/2) 2  Delayed Recall (0/5) 2  Orientation (0/6) 6  Total 26  Adjusted Score (based on education) 26     CRANIAL NERVE:  2nd, 3rd, 4th, 6th - pupils equal and reactive to light, visual fields full to confrontation, extraocular muscles intact, no nystagmus 5th - facial sensation symmetric 7th - facial strength symmetric 8th - hearing intact 9th - palate elevates symmetrically, uvula midline 11th - shoulder shrug symmetric 12th - tongue protrusion midline  MOTOR:  normal bulk and tone, full strength in the BUE, BLE  SENSORY:  normal and symmetric to light touch, vibration  COORDINATION:  finger-nose-finger, fine finger movements normal  REFLEXES:  deep tendon reflexes present and symmetric  GAIT/STATION:  normal   DIAGNOSTIC DATA (LABS, IMAGING, TESTING) - I reviewed patient records, labs, notes, testing and imaging myself where available.  No results found for: "WBC", "HGB", "HCT", "MCV", "PLT" No results found for: "NA", "K", "CL", "CO2", "GLUCOSE", "BUN", "CREATININE", "CALCIUM", "PROT", "ALBUMIN", "AST", "ALT", "ALKPHOS", "BILITOT", "GFRNONAA", "GFRAA" No results found for: "CHOL", "HDL", "LDLCALC", "LDLDIRECT", "TRIG", "CHOLHDL" No results found for: "HGBA1C" No results found for: "VITAMINB12" No results found for: "TSH"  TSH 1.040 Vit B12 377  MRI BRAIN 2021 1. Prominent volume loss for the patient's stated age of 49 years, most  notably in the parietal and temporal lobes. Asymmetric hippocampal volume  is also noted.    ASSESSMENT AND PLAN  51 y.o. year old male with medical history including epilepsy, migraine headaches, anxiety/depression who is presenting with memory problem described as  being forgetful,  difficulty with recall, and asking the same question over and over.  Patient scored 26 out of 30 on the [place] which is normal and he is also able to perform all ADLs and IADLs.  I informed patient that he likely has mild cognitive impairment and I would like to refer him for formal neuropsychological testing.  He did have an MRI brain in 2021 which showed prominent volume loss in the parietal and temporal lobes.  He is pending a repeat MRI.  Advised patient to schedule it as soon as possible.  I will see him in 1 year for follow-up or sooner if worse.  He voices understanding.   1. Mild cognitive impairment   2. Obstructive sleep apnea syndrome   3. Seizure disorder (HCC)   4. Migraine without aura and without status migrainosus, not intractable      Patient Instructions  Continue current medications Referral to neuropsychiatry  Vitamin B12 supplement  Please schedule your MRI Brain  Follow up in 1 year    Orders Placed This Encounter  Procedures   Ambulatory referral to Neuropsychology    Meds ordered this encounter  Medications   Cyanocobalamin 1000 MCG CAPS    Sig: Take 1,000 mcg by mouth daily.    Dispense:  30 capsule    Refill:  3    Return in about 1 year (around 12/28/2022).  I have spent a total of 65 minutes dedicated to this patient today, preparing to see patient, performing a medically appropriate examination and evaluation, ordering tests and/or medications and procedures, and counseling and educating the patient/family/caregiver; independently interpreting result and communicating results to the family/patient/caregiver; and documenting clinical information in the electronic medical record.    Windell Norfolk, MD 12/27/2021, 5:08 PM  [facility] [address] [place], [place] [address] [phone]

## 2021-12-27 NOTE — Patient Instructions (Signed)
Continue current medications Referral to neuropsychiatry  Vitamin B12 supplement  Please schedule your MRI Brain  Follow up in 1 year

## 2021-12-30 ENCOUNTER — Telehealth: Payer: Self-pay | Admitting: Neurology

## 2021-12-30 NOTE — Telephone Encounter (Signed)
Referral for Neuropsychology sent to [facility] [phone]. 

## 2022-01-17 ENCOUNTER — Other Ambulatory Visit: Payer: Self-pay

## 2022-01-17 MED ORDER — LAMOTRIGINE 150 MG PO TABS: 225.0000 mg | ORAL_TABLET | Freq: Two times a day (BID) | ORAL | 0 refills | Status: DC

## 2022-01-17 NOTE — Progress Notes (Signed)
Rx authorization request was faxed to [place] by [facility]. Last office visit note states to continue on current medications and return back to the clinic in 1 year. Rx filled.

## 2022-01-20 NOTE — Telephone Encounter (Signed)
Multiple attempts were made to contact the patient by phone/voicemail. Patient has not returned or answered [facility]S calls. 

## 2022-02-23 NOTE — Telephone Encounter (Signed)
Interview: 04/19/2022 Testing: 04/19/2022 Feedback: 04/28/2022

## 2022-04-11 ENCOUNTER — Other Ambulatory Visit: Payer: Self-pay

## 2022-04-11 MED ORDER — LAMOTRIGINE 150 MG PO TABS: 225.0000 mg | ORAL_TABLET | Freq: Two times a day (BID) | ORAL | 0 refills | Status: DC

## 2022-06-22 ENCOUNTER — Other Ambulatory Visit: Payer: Self-pay

## 2022-06-22 MED ORDER — LAMOTRIGINE 150 MG PO TABS: 225.0000 mg | ORAL_TABLET | Freq: Two times a day (BID) | ORAL | 0 refills | Status: DC

## 2022-07-19 MED ORDER — VITAMIN B-12 1000 MCG PO TABS: 1000.0000 ug | ORAL_TABLET | Freq: Every day | ORAL | 0 refills | Status: DC

## 2022-07-19 NOTE — Progress Notes (Signed)
Refilled vitamin b12 supplement per last office visit note.

## 2022-07-19 NOTE — Addendum Note (Signed)
Addended by: Kristen Loader on: 07/19/2022 04:50 PM   Modules accepted: Orders

## 2023-01-02 ENCOUNTER — Ambulatory Visit (INDEPENDENT_AMBULATORY_CARE_PROVIDER_SITE_OTHER): Payer: Managed Care, Other (non HMO) | Admitting: Neurology

## 2023-01-02 ENCOUNTER — Encounter: Payer: Self-pay | Admitting: Neurology

## 2023-01-02 VITALS — BP 124/68 | HR 78 | Ht 65.0 in | Wt 187.5 lb

## 2023-01-02 DIAGNOSIS — G3184 Mild cognitive impairment, so stated: Secondary | ICD-10-CM

## 2023-01-02 MED ORDER — VITAMIN B-12 1000 MCG PO TABS: 1000.0000 ug | ORAL_TABLET | Freq: Every day | ORAL | 3 refills | Status: AC

## 2023-01-02 NOTE — Progress Notes (Unsigned)
[facility]  PATIENT: Louis Poole DOB: December 26, 1970  REQUESTING CLINICIAN: Paulina Fusi, MD HISTORY FROM: Patient  REASON FOR VISIT: Memory problem    HISTORICAL  CHIEF COMPLAINT:  Chief Complaint  Patient presents with   Follow-up    Rm 13. Patient alone, reports mild/ if any changes in the memory, no new symptoms that he is aware of.    INTERVAL HISTORY 01/02/2023 Patient presents today for follow-up, last visit was a year ago.  Since then he has been doing well.  He actually thinks that his memory is better.  He is back to therapy which has been helpful per patient.  Currently no other complaint or concerns.  Overall is doing very well.  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   HISTORY OF PRESENT ILLNESS:  This is a 52 year old gentleman past medical history of migraine headaches, seizure disorder, tremor, who is presenting with memory problems.  For his seizure disorder and migraine headaches, he has been previously seen by Dr. Anne Hahn at this establishment and also being followed at [facility]. Seizures are well controlled on Lamictal, last seizure was in 2012 also his migraine headaches are well controlled.   Today he is presenting with complaint of memory problem that has been going on for the past 2 years and getting worse.  He reported he has problems remembering, he will ask the same questions over and over and people will reply to him "I just told you the answer".  He reported numerous incidents where he will think of something but something else will come out of his mind.  He does have problem with recall, does not remember a lot.  He reports that he used to be a straight A student but took a recent class about child safety seat and fell the class, and he barely passed his online training module which has been easy for him to do in the past.   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  He reports  family history of dementia on his maternal grandmother who was diagnosed in the early 72s.  He was seen by his primary care doctor who ordered TSH and B12 level which were normal and also ordered MRI brain which has not been completed yet.    TBI:   No past history of TBI Stroke:   no past history of stroke Seizures:   History of seizures, last seizure was March 2012 Sleep:   history of sleep apnea, has central and obstructive.  He is on Bipap machine Mood: Patient has a history of anxiety and depression  Functional status: independent in all ADLs and IADLs Patient lives with spouse  Cooking: wife Cleaning: wife  Shopping: Will definitely need a list  Bathing: patient  Toileting: patient  Driving: Yes, no issues  Bills: wife, always did   Ever left the stove on by accident?: no Forget how to use items around the house?: no Getting lost going to familiar places?: No Forgetting loved ones names?: No Word finding difficulty? Yes  Sleep: Not good, has been getting 6 to 7 hours    OTHER MEDICAL CONDITIONS: Seizure disorder, Sleep apnea, tremors, Migraines, Depression   REVIEW OF SYSTEMS: Full 14 system review of systems performed and negative with exception of: as noted in the HPI   ALLERGIES: Allergies  Allergen Reactions   Cashew Nut (Anacardium Occidentale) Skin Test     HOME MEDICATIONS: Outpatient  Medications Prior to Visit  Medication Sig Dispense Refill   amitriptyline (ELAVIL) 50 MG tablet Take by mouth.     butalbital-acetaminophen-caffeine (FIORICET, ESGIC) 50-325-40 MG tablet Take 1 tablet by mouth 4 (four) times daily as needed.   0   gabapentin (NEURONTIN) 300 MG capsule TAKE ONE CAPSULE BY MOUTH AT BEDTIME TO help tension HEADACHE  0   lamoTRIgine (LAMICTAL) 150 MG tablet Take 1.5 tablets (225 mg total) by mouth 2 (two) times daily. 270 tablet 0   LORazepam (ATIVAN) 1 MG tablet TAKE ONE TABLET BY MOUTH AS NEEDED, take 0.5 in the am and 1 mg in evening for  anxiety     ondansetron (ZOFRAN) 4 MG tablet Take 1 tablet (4 mg) by mouth every 8 hours as needed for nausea     pantoprazole (PROTONIX) 40 MG tablet Take by mouth.     PARoxetine (PAXIL) 40 MG tablet Take 40 mg by mouth daily.     polyethylene glycol powder (GLYCOLAX/MIRALAX) 17 GM/SCOOP powder Take 17 g by mouth daily.     promethazine (PHENERGAN) 12.5 MG tablet Take 12.5 mg by mouth every 6 (six) hours as needed for nausea.     propranolol (INDERAL) 40 MG tablet 40 mg in the morning and 80 mg in the evening     rosuvastatin (CRESTOR) 5 MG tablet Take by mouth.     Vitamin D, Ergocalciferol, (DRISDOL) 50000 units CAPS capsule TAKE ONE CAPSULE BY MOUTH ONCE WEEKLY FOR EIGHT WEEKS  3   ZEPBOUND 5 MG/0.5ML Pen Inject 5 mg into the skin once a week.     cyanocobalamin (VITAMIN B12) 1000 MCG tablet Take 1 tablet (1,000 mcg total) by mouth daily. 30 tablet 0   sertraline (ZOLOFT) 100 MG tablet Take 100 mg by mouth daily.     ZOMIG 5 MG nasal solution 1 spray as needed.  12   ibuprofen (ADVIL) 800 MG tablet Take 800 mg by mouth every 8 (eight) hours as needed.     sertraline (ZOLOFT) 25 MG tablet Take 25 mg by mouth daily.     traMADol (ULTRAM) 50 MG tablet Take 50 mg by mouth every 6 (six) hours as needed.      No facility-administered medications prior to visit.    PAST MEDICAL HISTORY: Past Medical History:  Diagnosis Date   Allergy    seasonal   Anxiety    Common migraine with intractable migraine 09/30/2016   Depression    Epilepsy (HCC)    Epilepsy (HCC) 2012   GERD (gastroesophageal reflux disease)    Headache    Hyperlipidemia    Memory loss    Seasonal allergies    Sleep apnea    Cpap   Tremor     PAST SURGICAL HISTORY: Past Surgical History:  Procedure Laterality Date   KNEE SURGERY Left    WISDOM TOOTH EXTRACTION      FAMILY HISTORY: Family History  Problem Relation Age of Onset   Congestive Heart Failure Mother    Heart disease Father    Alzheimer's disease  Maternal Grandmother    Heart attack Maternal Grandfather    Ovarian cancer Paternal Grandmother    Heart attack Paternal Grandfather    Colon cancer Neg Hx    Colon polyps Neg Hx    Esophageal cancer Neg Hx    Rectal cancer Neg Hx    Stomach cancer Neg Hx     SOCIAL HISTORY: Social History   Socioeconomic History   Marital status: Married  Spouse name: Not on file   Number of children: 1   Years of education: college   Highest education level: Bachelor's degree (e.g., BA, AB, BS)  Occupational History   Occupation: Emergency planning/management officer  Tobacco Use   Smoking status: Never   Smokeless tobacco: Never  Substance and Sexual Activity   Alcohol use: No   Drug use: No   Sexual activity: Not on file  Other Topics Concern   Not on file  Social History Narrative   Lives at home with wife.   One adopted child.   Right-handed.   Caffeine use: 6-8 cups per day.    Social Determinants of Health   Financial Resource Strain: Not on file  Food Insecurity: Not on file  Transportation Needs: Not on file  Physical Activity: Not on file  Stress: Not on file  Social Connections: Not on file  Intimate Partner Violence: Not on file    PHYSICAL EXAM  GENERAL EXAM/CONSTITUTIONAL: Vitals:  Vitals:   01/02/23 1435  BP: 124/68  Pulse: 78  Weight: 187 lb 8 oz (85 kg)  Height: 5\' 5"  (1.651 m)   Body mass index is 31.2 kg/m. Wt Readings from Last 3 Encounters:  01/02/23 187 lb 8 oz (85 kg)  12/27/21 207 lb 8 oz (94.1 kg)  04/28/20 201 lb 6.4 oz (91.4 kg)   Patient is in no distress; well developed, nourished and groomed; neck is supple  MUSCULOSKELETAL: Gait, strength, tone, movements noted in Neurologic exam below  NEUROLOGIC: MENTAL STATUS:      No data to display         awake, alert, oriented to person, place and time recent and remote memory intact normal attention and concentration language fluent, comprehension intact, naming intact fund of knowledge  appropriate     01/02/2023    2:36 PM 12/27/2021    2:13 PM  Montreal Cognitive Assessment   Visuospatial/ Executive (0/5) 5 5  Naming (0/3) 3 3  Attention: Read list of digits (0/2) 2 2  Attention: Read list of letters (0/1) 1 1  Attention: Serial 7 subtraction starting at 100 (0/3) 3 3  Language: Repeat phrase (0/2) 0 1  Language : Fluency (0/1) 1 1  Abstraction (0/2) 2 2  Delayed Recall (0/5) 5 2  Orientation (0/6) 6 6  Total 28 26  Adjusted Score (based on education)  26     CRANIAL NERVE:  2nd, 3rd, 4th, 6th - visual fields full to confrontation, extraocular muscles intact, no nystagmus 5th - facial sensation symmetric 7th - facial strength symmetric 8th - hearing intact 9th - palate elevates symmetrically, uvula midline 11th - shoulder shrug symmetric 12th - tongue protrusion midline  MOTOR:  normal bulk and tone, full strength in the BUE, BLE  SENSORY:  normal and symmetric to light touch, vibration  COORDINATION:  finger-nose-finger, fine finger movements normal  REFLEXES:  deep tendon reflexes present and symmetric  GAIT/STATION:  normal   DIAGNOSTIC DATA (LABS, IMAGING, TESTING) - I reviewed patient records, labs, notes, testing and imaging myself where available.  No results found for: "WBC", "HGB", "HCT", "MCV", "PLT" No results found for: "NA", "K", "CL", "CO2", "GLUCOSE", "BUN", "CREATININE", "CALCIUM", "PROT", "ALBUMIN", "AST", "ALT", "ALKPHOS", "BILITOT", "GFRNONAA", "GFRAA" No results found for: "CHOL", "HDL", "LDLCALC", "LDLDIRECT", "TRIG", "CHOLHDL" No results found for: "HGBA1C" No results found for: "VITAMINB12" No results found for: "TSH"  TSH 1.040 Vit B12 377  MRI BRAIN 2021 1. Prominent volume loss for the patient's stated age  of 49 years, most  notably in the parietal and temporal lobes. Asymmetric hippocampal volume  is also noted.    Neuropsych evaluation  In summary the most likely explanation of his deficits is  multifactorial, with contribution from moderate to severe ongoing symptoms of depression and anxiety neurologically to the chronic stress Also related to his chronic suboptimal sleep pattern which can exacerbate cognitive functioning.  Finally, polypharmacy for his migraines, depression, seizure history, and sleep issue may also play a role, as many of the medication Mr. Kurtyka is taking have been associated with mental fogginess, concentration, or memory problems in some patients.   ASSESSMENT AND PLAN  52 y.o. year old male with medical history including epilepsy, migraine headaches, anxiety/depression who is presenting for follow up.  At last visit plan was to obtain a full neuropsychological testing which he completed, and diagnosed with anxiety, depression, ADD which contribute to his cognitive impairment.  He reports since then he has been doing therapy and actually feels that he is much better.  Overall he is stable.  Will continue to follow-up on a yearly basis.  He is comfortable with plans.   1. Mild cognitive impairment       Patient Instructions  Continue current medications Follow-up in 1 year or sooner if worse    No orders of the defined types were placed in this encounter.   Meds ordered this encounter  Medications   cyanocobalamin (VITAMIN B12) 1000 MCG tablet    Sig: Take 1 tablet (1,000 mcg total) by mouth daily.    Dispense:  90 tablet    Refill:  3    Return in about 1 year (around 01/02/2024).   I have spent a total of 35 minutes dedicated to this patient today, preparing to see patient, performing a medically appropriate examination and evaluation, ordering tests and/or medications and procedures, and counseling and educating the patient/family/caregiver; independently interpreting result and communicating results to the family/patient/caregiver; and documenting clinical information in the electronic medical record.   Windell Norfolk, MD 01/03/2023, 9:52  AM  [facility] [address] [place], [place] [address] [phone]

## 2023-01-03 NOTE — Patient Instructions (Signed)
Continue current medications Follow-up in 1 year or sooner if worse. 

## 2023-06-02 ENCOUNTER — Telehealth: Payer: Self-pay | Admitting: Neurology

## 2023-06-02 NOTE — Telephone Encounter (Signed)
Pt called needing a refill on his  lamoTRIgine (LAMICTAL) 150 MG tablet and is needing it sent to [facility] drug in [place]

## 2023-06-05 MED ORDER — LAMOTRIGINE 150 MG PO TABS: 225.0000 mg | ORAL_TABLET | Freq: Two times a day (BID) | ORAL | 0 refills | Status: DC

## 2023-06-05 NOTE — Telephone Encounter (Signed)
Phone room: Let pt know the refill has been sent! Thanks,  Production assistant, radio

## 2023-09-12 ENCOUNTER — Other Ambulatory Visit: Payer: Self-pay

## 2023-09-12 MED ORDER — LAMOTRIGINE 150 MG PO TABS: 225.0000 mg | ORAL_TABLET | Freq: Two times a day (BID) | ORAL | 0 refills | Status: DC

## 2024-01-02 ENCOUNTER — Ambulatory Visit (INDEPENDENT_AMBULATORY_CARE_PROVIDER_SITE_OTHER): Payer: Managed Care, Other (non HMO) | Admitting: Neurology

## 2024-01-02 ENCOUNTER — Encounter: Payer: Self-pay | Admitting: Neurology

## 2024-01-02 VITALS — BP 125/81 | HR 96 | Resp 14

## 2024-01-02 DIAGNOSIS — G3184 Mild cognitive impairment, so stated: Secondary | ICD-10-CM

## 2024-01-02 DIAGNOSIS — G40802 Other epilepsy, not intractable, without status epilepticus: Secondary | ICD-10-CM | POA: Diagnosis not present

## 2024-01-02 NOTE — Patient Instructions (Addendum)
 Continue current medications Continue to follow-up with your doctors Please contact us  if you do want to come off lamotrigine .  At that time we will first obtain a routine EEG then if normal start decreasing the dose with plan to stop it in a few months. Follow-up in 1 year or sooner if worse.

## 2024-01-02 NOTE — Progress Notes (Signed)
 [facility]  PATIENT: Louis Poole DOB: [birth year]  REQUESTING CLINICIAN: Adrian Hopper, MD HISTORY FROM: Patient  REASON FOR VISIT: Memory problem    HISTORICAL  CHIEF COMPLAINT:  Chief Complaint  Patient presents with   Follow-up    Rm12, alone, Memory loss: moca score of 26. Sz: no recent sz, well controlled.    INTERVAL HISTORY 01/02/2024:  Patient presents today for follow-up, he is alone.  Last visit was a year ago, since then he has been doing well.  Denies any seizure or seizure like activity.  He also tells me that his memory is better, no complaints.  He reports that he was having some increased irritability, at work, and has to take time off.  Currently he has been off for the past 4 weeks and is doing extremely well.  Feels like the break was very good for him.  He is thinking about retirement early next year.   INTERVAL HISTORY 01/02/2023 Patient presents today for follow-up, last visit was a year ago.  Since then he has been doing well.  He actually thinks that his memory is better.  He is back to therapy which has been helpful per patient.  Currently no other complaint or concerns.  Overall is doing very well.  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   HISTORY OF PRESENT ILLNESS:  This is a 53 year old gentleman past medical history of migraine headaches, seizure disorder, tremor, who is presenting with memory problems.  For his seizure disorder and migraine headaches, he has been previously seen by Dr. Tilda Fogo at this establishment and also being followed at [facility]. Seizures are well controlled on Lamictal , last seizure was in 2012 also his migraine headaches are well controlled.   Today he is presenting with complaint of memory problem that has been going on for the past 2 years and getting worse.  He reported he has problems remembering, he will ask the same questions over and over and people will reply to him I  just told you the answer.  He reported numerous incidents where he will think of something but something else will come out of his mind.  He does have problem with recall, does not remember a lot.  He reports that he used to be a straight A student but took a recent class about child safety seat and fell the class, and he barely passed his online training module which has been easy for him to do in the past.   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  He reports family history of dementia on his maternal grandmother who was diagnosed in the early 67s.  He was seen by his primary care doctor who ordered TSH and B12 level which were normal and also ordered MRI brain which has not been completed yet.    TBI:   No past history of TBI Stroke:   no past history of stroke Seizures:   History of seizures, last seizure was March 2012 Sleep:   history of sleep apnea, has central and obstructive.  He is on Bipap machine Mood: Patient has a history of anxiety and depression  Functional status: independent in all ADLs and IADLs Patient lives with spouse  Cooking: wife Cleaning: wife  Shopping: Will definitely need a list  Bathing: patient  Toileting: patient  Driving: Yes, no issues  Bills: wife, always did   Ever left the stove on by  accident?: no Forget how to use items around the house?: no Getting lost going to familiar places?: No Forgetting loved ones names?: No Word finding difficulty? Yes  Sleep: Not good, has been getting 6 to 7 hours    OTHER MEDICAL CONDITIONS: Seizure disorder, Sleep apnea, tremors, Migraines, Depression   REVIEW OF SYSTEMS: Full 14 system review of systems performed and negative with exception of: as noted in the HPI   ALLERGIES: Allergies  Allergen Reactions   Cashew Nut (Anacardium Occidentale) Skin Test     HOME MEDICATIONS: Outpatient Medications Prior to Visit  Medication Sig Dispense Refill   amitriptyline (ELAVIL) 50 MG  tablet Take by mouth.     butalbital-acetaminophen-caffeine (FIORICET, ESGIC) 50-325-40 MG tablet Take 1 tablet by mouth 4 (four) times daily as needed.   0   gabapentin (NEURONTIN) 300 MG capsule TAKE ONE CAPSULE BY MOUTH AT BEDTIME TO help tension HEADACHE  0   lamoTRIgine  (LAMICTAL ) 150 MG tablet Take 1.5 tablets (225 mg total) by mouth 2 (two) times daily. 270 tablet 0   LORazepam (ATIVAN) 1 MG tablet TAKE ONE TABLET BY MOUTH AS NEEDED, take 0.5 in the am and 1 mg in evening for anxiety     ondansetron (ZOFRAN) 4 MG tablet Take 1 tablet (4 mg) by mouth every 8 hours as needed for nausea     pantoprazole (PROTONIX) 40 MG tablet Take by mouth.     polyethylene glycol powder (GLYCOLAX/MIRALAX) 17 GM/SCOOP powder Take 17 g by mouth daily.     promethazine (PHENERGAN) 12.5 MG tablet Take 12.5 mg by mouth every 6 (six) hours as needed for nausea.     propranolol (INDERAL) 40 MG tablet 40 mg in the morning and 80 mg in the evening     rosuvastatin (CRESTOR) 5 MG tablet Take by mouth.     Semaglutide-Weight Management (WEGOVY) 0.25 MG/0.5ML SOAJ Inject 0.25 mg into the skin every 7 (seven) days.     Vitamin D, Ergocalciferol, (DRISDOL) 50000 units CAPS capsule TAKE ONE CAPSULE BY MOUTH ONCE WEEKLY FOR EIGHT WEEKS  3   VRAYLAR 1.5 MG capsule Take 1.5 mg by mouth daily.     PARoxetine (PAXIL) 40 MG tablet Take 40 mg by mouth daily.     sertraline (ZOLOFT) 100 MG tablet Take 100 mg by mouth daily.     ZEPBOUND 5 MG/0.5ML Pen Inject 5 mg into the skin once a week.     ZOMIG 5 MG nasal solution 1 spray as needed.  12   No facility-administered medications prior to visit.    PAST MEDICAL HISTORY: Past Medical History:  Diagnosis Date   Allergy    seasonal   Anxiety    Common migraine with intractable migraine 09/30/2016   Depression    Epilepsy (HCC)    Epilepsy (HCC) 2012   GERD (gastroesophageal reflux disease)    Headache    Hyperlipidemia    Memory loss    Seasonal allergies    Sleep  apnea    Cpap   Tremor     PAST SURGICAL HISTORY: Past Surgical History:  Procedure Laterality Date   KNEE SURGERY Left    WISDOM TOOTH EXTRACTION      FAMILY HISTORY: Family History  Problem Relation Age of Onset   Congestive Heart Failure Mother    Heart disease Father    Alzheimer's disease Maternal Grandmother    Heart attack Maternal Grandfather    Ovarian cancer Paternal Grandmother    Heart attack Paternal  Grandfather    Colon cancer Neg Hx    Colon polyps Neg Hx    Esophageal cancer Neg Hx    Rectal cancer Neg Hx    Stomach cancer Neg Hx     SOCIAL HISTORY: Social History   Socioeconomic History   Marital status: Married    Spouse name: Not on file   Number of children: 1   Years of education: college   Highest education level: Bachelor's degree (e.g., BA, AB, BS)  Occupational History   Occupation: Emergency planning/management officer  Tobacco Use   Smoking status: Never   Smokeless tobacco: Never  Substance and Sexual Activity   Alcohol use: No   Drug use: No   Sexual activity: Not on file  Other Topics Concern   Not on file  Social History Narrative   Lives at home with wife.   One adopted child.   Right-handed.   Caffeine use: 6-8 cups per day.    Social Drivers of Corporate investment banker Strain: Not on file  Food Insecurity: Not on file  Transportation Needs: Not on file  Physical Activity: Not on file  Stress: Not on file  Social Connections: Not on file  Intimate Partner Violence: Not on file    PHYSICAL EXAM  GENERAL EXAM/CONSTITUTIONAL: Vitals:  Vitals:   01/02/24 1442  BP: 125/81  Pulse: 96  Resp: 14  SpO2: 97%   There is no height or weight on file to calculate BMI. Wt Readings from Last 3 Encounters:  01/02/23 187 lb 8 oz (85 kg)  12/27/21 207 lb 8 oz (94.1 kg)  04/28/20 201 lb 6.4 oz (91.4 kg)   Patient is in no distress; well developed, nourished and groomed; neck is supple  MUSCULOSKELETAL: Gait, strength, tone, movements noted  in Neurologic exam below  NEUROLOGIC: MENTAL STATUS:      No data to display         awake, alert, oriented to person, place and time recent and remote memory intact normal attention and concentration language fluent, comprehension intact, naming intact fund of knowledge appropriate     01/02/2024    2:47 PM 01/02/2023    2:36 PM 12/27/2021    2:13 PM  Montreal Cognitive Assessment   Visuospatial/ Executive (0/5) 5 5 5   Naming (0/3) 3 3 3   Attention: Read list of digits (0/2) 2 2 2   Attention: Read list of letters (0/1) 1 1 1   Attention: Serial 7 subtraction starting at 100 (0/3) 3 3 3   Language: Repeat phrase (0/2) 1 0 1  Language : Fluency (0/1) 1 1 1   Abstraction (0/2) 2 2 2   Delayed Recall (0/5) 2 5 2   Orientation (0/6) 6 6 6   Total 26 28 26   Adjusted Score (based on education)   26     CRANIAL NERVE:  2nd, 3rd, 4th, 6th - visual fields full to confrontation, extraocular muscles intact, no nystagmus 5th - facial sensation symmetric 7th - facial strength symmetric 8th - hearing intact 9th - palate elevates symmetrically, uvula midline 11th - shoulder shrug symmetric 12th - tongue protrusion midline  MOTOR:  normal bulk and tone, full strength in the BUE, BLE  SENSORY:  normal and symmetric to light touch  COORDINATION:  finger-nose-finger, fine finger movements normal  GAIT/STATION:  normal   DIAGNOSTIC DATA (LABS, IMAGING, TESTING) - I reviewed patient records, labs, notes, testing and imaging myself where available.  No results found for: WBC, HGB, HCT, MCV, PLT No results found  for: NA, K, CL, CO2, GLUCOSE, BUN, CREATININE, CALCIUM, PROT, ALBUMIN, AST, ALT, ALKPHOS, BILITOT, GFRNONAA, GFRAA No results found for: CHOL, HDL, LDLCALC, LDLDIRECT, TRIG, CHOLHDL No results found for: OZHY8M No results found for: VITAMINB12 No results found for: TSH  TSH 1.040 Vit B12 377  MRI BRAIN  2021 1. Prominent volume loss for the patient's stated age of 49 years, most  notably in the parietal and temporal lobes. Asymmetric hippocampal volume  is also noted.    Neuropsych evaluation  In summary the most likely explanation of his deficits is multifactorial, with contribution from moderate to severe ongoing symptoms of depression and anxiety neurologically to the chronic stress Also related to his chronic suboptimal sleep pattern which can exacerbate cognitive functioning.  Finally, polypharmacy for his migraines, depression, seizure history, and sleep issue may also play a role, as many of the medication Louis Poole is taking have been associated with mental fogginess, concentration, or memory problems in some patients.   ASSESSMENT AND PLAN  53 y.o. year old male with medical history including epilepsy, migraine headaches, anxiety/depression who is presenting for follow up.  He is doing well, has not had any seizure or seizure like activity since 2012.  He is interested in coming off Lamotrigine  but would like to first discuss with his wife who is a Engineer, civil (consulting).  When it comes to his memory problem, he tells me that he is doing better, feeling much better, actually his time off from work was very helpful and he still has a couple weeks left.  Again he is looking forward retirement early next year.  Patient will contact us  if he is willing to discontinue antiseizure medication, at that time we will obtain first a routine EEG and if normal, then will start the titration down.  Will plan to stop the lamotrigine  in few months.  Otherwise I will see him for follow-up in 1 year   1. Mild cognitive impairment   2. Other epilepsy without status epilepticus, not intractable (HCC)     Patient Instructions  Continue current medications Continue to follow-up with your doctors Please contact us  if you do want to come off lamotrigine .  At that time we will first obtain a routine EEG then if normal start  decreasing the dose with plan to stop it in a few months. Follow-up in 1 year or sooner if worse.  No orders of the defined types were placed in this encounter.   No orders of the defined types were placed in this encounter.   Return in about 1 year (around 01/01/2025).  I personally spent a total of 30 minutes in the care of the patient today including preparing to see the patient, getting/reviewing separately obtained history, performing a medically appropriate exam/evaluation, counseling and educating, documenting clinical information in the EHR, and independently interpreting results.   Cassandra Cleveland, MD 01/02/2024, 4:52 PM  [facility] [address] [place], [place] [address] [phone]

## 2024-02-08 ENCOUNTER — Other Ambulatory Visit: Payer: Self-pay

## 2024-02-08 MED ORDER — LAMOTRIGINE 150 MG PO TABS: 225.0000 mg | ORAL_TABLET | Freq: Two times a day (BID) | ORAL | 0 refills | Status: DC

## 2024-06-05 ENCOUNTER — Other Ambulatory Visit: Payer: Self-pay | Admitting: Neurology

## 2024-06-05 NOTE — Telephone Encounter (Signed)
 Last seen on 01/02/24 Follow up scheduled on 01/01/25

## 2024-06-06 ENCOUNTER — Other Ambulatory Visit: Payer: Self-pay

## 2024-06-06 MED ORDER — VITAMIN B-12 1000 MCG PO TABS: 1000.0000 ug | ORAL_TABLET | Freq: Every day | ORAL | 3 refills | Status: AC

## 2024-06-06 NOTE — Telephone Encounter (Signed)
 Patient requested  a refill of the vitamin B-12. He states it is cheaper through the pharmacy than it is OTC. Please advise on refill request. Last sent in January of 2024 for 30 days and 0 refills.

## 2024-08-20 ENCOUNTER — Telehealth: Payer: Self-pay | Admitting: Neurology

## 2025-01-01 ENCOUNTER — Ambulatory Visit: Admitting: Neurology
